# Patient Record
Sex: Male | Born: 1965 | Race: White | Hispanic: No | Marital: Married | Smoking: Never smoker
Health system: Southern US, Community
[De-identification: ages and names within clinical notes are randomized; demographics above are authoritative.]

## PROBLEM LIST (undated history)

## (undated) DIAGNOSIS — K5792 Diverticulitis of intestine, part unspecified, without perforation or abscess without bleeding: Secondary | ICD-10-CM

## (undated) HISTORY — PX: HIP SURGERY: SHX245

## (undated) HISTORY — PX: CHOLECYSTECTOMY: SHX55

---

## 2004-08-08 ENCOUNTER — Ambulatory Visit: Payer: Self-pay | Admitting: Oncology

## 2004-10-06 ENCOUNTER — Ambulatory Visit: Payer: Self-pay | Admitting: Oncology

## 2008-11-25 ENCOUNTER — Encounter: Admission: RE | Admit: 2008-11-25 | Discharge: 2008-11-25 | Payer: Self-pay | Admitting: Family Medicine

## 2010-05-24 ENCOUNTER — Emergency Department (HOSPITAL_COMMUNITY)
Admission: EM | Admit: 2010-05-24 | Discharge: 2010-05-25 | Disposition: A | Discharge status: Home / Self Care | Payer: 59 | Attending: Emergency Medicine | Admitting: Emergency Medicine

## 2010-05-24 ENCOUNTER — Emergency Department (HOSPITAL_COMMUNITY): Payer: 59

## 2010-05-24 DIAGNOSIS — K7689 Other specified diseases of liver: Secondary | ICD-10-CM | POA: Insufficient documentation

## 2010-05-24 DIAGNOSIS — R109 Unspecified abdominal pain: Secondary | ICD-10-CM | POA: Insufficient documentation

## 2010-05-24 DIAGNOSIS — R079 Chest pain, unspecified: Secondary | ICD-10-CM | POA: Insufficient documentation

## 2010-05-24 DIAGNOSIS — K5730 Diverticulosis of large intestine without perforation or abscess without bleeding: Secondary | ICD-10-CM | POA: Insufficient documentation

## 2010-05-25 ENCOUNTER — Encounter (HOSPITAL_COMMUNITY): Payer: Self-pay

## 2010-05-25 ENCOUNTER — Emergency Department (HOSPITAL_COMMUNITY): Payer: 59

## 2010-05-25 LAB — CBC
Hemoglobin: 13.6 g/dL (ref 13.0–17.0)
MCH: 28.6 pg (ref 26.0–34.0)
MCHC: 33.3 g/dL (ref 30.0–36.0)
MCV: 85.9 fL (ref 78.0–100.0)
Platelets: 277 10*3/uL (ref 150–400)
RBC: 4.76 MIL/uL (ref 4.22–5.81)

## 2010-05-25 LAB — COMPREHENSIVE METABOLIC PANEL
Alkaline Phosphatase: 74 U/L (ref 39–117)
BUN: 13 mg/dL (ref 6–23)
Chloride: 105 mEq/L (ref 96–112)
Creatinine, Ser: 0.98 mg/dL (ref 0.4–1.5)
GFR calc non Af Amer: >60 mL/min (ref >60)
Glucose, Bld: 98 mg/dL (ref 70–99)
Potassium: 4.1 mEq/L (ref 3.5–5.1)
Total Bilirubin: 0.5 mg/dL (ref 0.3–1.2)

## 2010-05-25 LAB — URINALYSIS, ROUTINE W REFLEX MICROSCOPIC
Bilirubin Urine: NEGATIVE
Glucose, UA: NEGATIVE mg/dL
Ketones, ur: NEGATIVE mg/dL
Protein, ur: NEGATIVE mg/dL
pH: 6.5 (ref 5.0–8.0)

## 2010-05-25 LAB — DIFFERENTIAL
Basophils Relative: 0 % (ref 0–1)
Eosinophils Absolute: 0.2 10*3/uL (ref 0.0–0.7)
Lymphs Abs: 3.1 10*3/uL (ref 0.7–4.0)
Monocytes Absolute: 0.9 10*3/uL (ref 0.1–1.0)
Monocytes Relative: 10 % (ref 3–12)

## 2010-05-25 LAB — LIPASE, BLOOD: Lipase: 24 U/L (ref 11–59)

## 2010-05-25 MED ORDER — IOHEXOL 300 MG/ML  SOLN
100.0000 mL | Freq: Once | INTRAMUSCULAR | Status: AC | PRN
  Administered 2010-05-25: 100 mL via INTRAVENOUS

## 2010-05-26 LAB — URINE CULTURE

## 2014-10-06 ENCOUNTER — Encounter (HOSPITAL_COMMUNITY): Payer: Self-pay

## 2014-10-06 ENCOUNTER — Emergency Department (HOSPITAL_COMMUNITY)
Admission: EM | Admit: 2014-10-06 | Discharge: 2014-10-06 | Disposition: A | Discharge status: Home / Self Care | Payer: 59 | Attending: Emergency Medicine | Admitting: Emergency Medicine

## 2014-10-06 ENCOUNTER — Emergency Department (HOSPITAL_COMMUNITY): Payer: 59

## 2014-10-06 DIAGNOSIS — R109 Unspecified abdominal pain: Secondary | ICD-10-CM | POA: Diagnosis present

## 2014-10-06 DIAGNOSIS — Z9049 Acquired absence of other specified parts of digestive tract: Secondary | ICD-10-CM | POA: Diagnosis not present

## 2014-10-06 DIAGNOSIS — K5732 Diverticulitis of large intestine without perforation or abscess without bleeding: Secondary | ICD-10-CM | POA: Diagnosis not present

## 2014-10-06 LAB — COMPREHENSIVE METABOLIC PANEL
ALBUMIN: 4.3 g/dL (ref 3.5–5.0)
ALT: 27 U/L (ref 17–63)
ANION GAP: 9 (ref 5–15)
AST: 20 U/L (ref 15–41)
Alkaline Phosphatase: 69 U/L (ref 38–126)
BUN: 16 mg/dL (ref 6–20)
CO2: 28 mmol/L (ref 22–32)
Calcium: 9.2 mg/dL (ref 8.9–10.3)
Chloride: 101 mmol/L (ref 101–111)
Creatinine, Ser: 1.03 mg/dL (ref 0.61–1.24)
GFR calc Af Amer: >60 mL/min (ref >60)
GFR calc non Af Amer: >60 mL/min (ref >60)
Glucose, Bld: 109 mg/dL — ABNORMAL HIGH (ref 65–99)
POTASSIUM: 4 mmol/L (ref 3.5–5.1)
SODIUM: 138 mmol/L (ref 135–145)
TOTAL PROTEIN: 7.3 g/dL (ref 6.5–8.1)
Total Bilirubin: 1.3 mg/dL — ABNORMAL HIGH (ref 0.3–1.2)

## 2014-10-06 LAB — CBC
HEMATOCRIT: 43.1 % (ref 39.0–52.0)
Hemoglobin: 14.6 g/dL (ref 13.0–17.0)
MCH: 29 pg (ref 26.0–34.0)
MCHC: 33.9 g/dL (ref 30.0–36.0)
MCV: 85.5 fL (ref 78.0–100.0)
PLATELETS: 270 10*3/uL (ref 150–400)
RBC: 5.04 MIL/uL (ref 4.22–5.81)
RDW: 13 % (ref 11.5–15.5)
WBC: 14.9 10*3/uL — AB (ref 4.0–10.5)

## 2014-10-06 LAB — URINE MICROSCOPIC-ADD ON

## 2014-10-06 LAB — URINALYSIS, ROUTINE W REFLEX MICROSCOPIC
BILIRUBIN URINE: NEGATIVE
GLUCOSE, UA: NEGATIVE mg/dL
KETONES UR: 15 mg/dL — AB
Leukocytes, UA: NEGATIVE
Nitrite: NEGATIVE
Protein, ur: NEGATIVE mg/dL
Specific Gravity, Urine: 1.013 (ref 1.005–1.030)
Urobilinogen, UA: 0.2 mg/dL (ref 0.0–1.0)
pH: 6 (ref 5.0–8.0)

## 2014-10-06 LAB — LIPASE, BLOOD: Lipase: 15 U/L — ABNORMAL LOW (ref 22–51)

## 2014-10-06 LAB — I-STAT CG4 LACTIC ACID, ED: Lactic Acid, Venous: 0.45 mmol/L — ABNORMAL LOW (ref 0.5–2.0)

## 2014-10-06 MED ORDER — METOCLOPRAMIDE HCL 5 MG/ML IJ SOLN
10.0000 mg | Freq: Once | INTRAMUSCULAR | Status: DC
  Filled 2014-10-06: qty 2

## 2014-10-06 MED ORDER — IOHEXOL 300 MG/ML  SOLN: 50.0000 mL | Freq: Once | INTRAMUSCULAR | Status: DC | PRN

## 2014-10-06 MED ORDER — CIPROFLOXACIN IN D5W 400 MG/200ML IV SOLN
400.0000 mg | Freq: Once | INTRAVENOUS | Status: AC
  Administered 2014-10-06: 400 mg via INTRAVENOUS
  Filled 2014-10-06: qty 200

## 2014-10-06 MED ORDER — CIPROFLOXACIN HCL 500 MG PO TABS: 500.0000 mg | ORAL_TABLET | Freq: Two times a day (BID) | ORAL | Status: DC

## 2014-10-06 MED ORDER — SODIUM CHLORIDE 0.9 % IV BOLUS (SEPSIS)
1000.0000 mL | Freq: Once | INTRAVENOUS | Status: AC
  Administered 2014-10-06: 1000 mL via INTRAVENOUS

## 2014-10-06 MED ORDER — HYDROMORPHONE HCL 1 MG/ML IJ SOLN
1.0000 mg | INTRAMUSCULAR | Status: DC | PRN
  Filled 2014-10-06: qty 1

## 2014-10-06 MED ORDER — HYDROCODONE-ACETAMINOPHEN 5-325 MG PO TABS: 1.0000 | ORAL_TABLET | Freq: Four times a day (QID) | ORAL | Status: DC | PRN

## 2014-10-06 MED ORDER — ONDANSETRON HCL 4 MG/2ML IJ SOLN
4.0000 mg | Freq: Once | INTRAMUSCULAR | Status: AC
  Administered 2014-10-06: 4 mg via INTRAVENOUS
  Filled 2014-10-06: qty 2

## 2014-10-06 MED ORDER — KETOROLAC TROMETHAMINE 30 MG/ML IJ SOLN
30.0000 mg | Freq: Once | INTRAMUSCULAR | Status: AC
  Administered 2014-10-06: 30 mg via INTRAVENOUS
  Filled 2014-10-06: qty 1

## 2014-10-06 MED ORDER — HYDROMORPHONE HCL 1 MG/ML IJ SOLN
1.0000 mg | Freq: Once | INTRAMUSCULAR | Status: AC
  Administered 2014-10-06: 1 mg via INTRAVENOUS
  Filled 2014-10-06: qty 1

## 2014-10-06 MED ORDER — IOHEXOL 300 MG/ML  SOLN
25.0000 mL | Freq: Once | INTRAMUSCULAR | Status: AC | PRN
  Administered 2014-10-06: 25 mL via ORAL

## 2014-10-06 MED ORDER — METRONIDAZOLE IN NACL 5-0.79 MG/ML-% IV SOLN
500.0000 mg | Freq: Once | INTRAVENOUS | Status: AC
Start: 2014-10-06 — End: 2014-10-06
  Administered 2014-10-06: 500 mg via INTRAVENOUS
  Filled 2014-10-06: qty 100

## 2014-10-06 MED ORDER — METRONIDAZOLE 500 MG PO TABS: 500.0000 mg | ORAL_TABLET | Freq: Three times a day (TID) | ORAL | Status: DC

## 2014-10-06 MED ORDER — IOHEXOL 300 MG/ML  SOLN
100.0000 mL | Freq: Once | INTRAMUSCULAR | Status: AC | PRN
  Administered 2014-10-06: 100 mL via INTRAVENOUS

## 2014-10-06 MED ORDER — ONDANSETRON HCL 4 MG PO TABS: 4.0000 mg | ORAL_TABLET | Freq: Four times a day (QID) | ORAL | Status: DC

## 2014-10-06 NOTE — ED Notes (Addendum)
Pt presents with c/o abdominal pain that started Monday morning. Pt reports the pain has gotten worse since the onset. Pt has had diarrhea and has had three doses of imodium.

## 2014-10-06 NOTE — ED Notes (Signed)
Pt began vomiting in CT,  This writer took IV Zofran and administered in CT  area

## 2014-10-06 NOTE — ED Provider Notes (Signed)
CSN: [id number]     Arrival date & time 10/06/14  0132 History   First MD Initiated Contact with Patient 10/06/14 0201     Chief Complaint  Patient presents with  . Abdominal Pain     (Consider location/radiation/quality/duration/timing/severity/associated sxs/prior Treatment) HPI Comments: 49 year old male with a history of BPH presents to the emergency department for further evaluation of abdominal pain. Patient states that abdominal pain began Monday morning. He describes the pain as intermittent up until the last 24 hours. Pain is now constant. He describes the pain as a deep ache which radiates to the tip of his penis. He experienced diarrhea at onset of symptoms which was watery and black in color. He took a total of 3 doses of Imodium; one tablet Monday morning, 1 tablet Monday evening, and a third tablet yesterday morning. He has not had a bowel movement in the last 24 hours. Patient denies any hematuria, fever, chest pain, shortness of breath, nausea, vomiting, hematochezia. He feels as though his pain is relieved slightly after urination. He denies a history of kidney stones. He states that his father had a hx of kidney stones. Abdominal surgical history significant for cholecystectomy.  Patient is a 49 y.o. male presenting with abdominal pain. The history is provided by the patient. No language interpreter was used.  Abdominal Pain Associated symptoms: diarrhea   Associated symptoms: no chest pain, no dysuria, no fever, no nausea, no shortness of breath and no vomiting     History reviewed. No pertinent past medical history. Past Surgical History  Procedure Laterality Date  . Cholecystectomy    . Hip surgery     No family history on file. History  Substance Use Topics  . Smoking status: Never Smoker   . Smokeless tobacco: Not on file  . Alcohol Use: Yes     Comment: occasionally     Review of Systems  Constitutional: Negative for fever.  Respiratory: Negative for  shortness of breath.   Cardiovascular: Negative for chest pain.  Gastrointestinal: Positive for abdominal pain and diarrhea. Negative for nausea and vomiting.  Genitourinary: Negative for dysuria, flank pain, discharge, penile swelling, scrotal swelling and testicular pain.  All other systems reviewed and are negative.   Allergies  Other and Sulfa antibiotics  Home Medications   Prior to Admission medications   Medication Sig Start Date End Date Taking? Authorizing Provider  ALPRAZolam Prudy Feeler) 0.5 MG tablet Take 0.5 mg by mouth at bedtime as needed. sleep   Yes Historical Provider, MD  diazepam (VALIUM) 5 MG tablet Take 5 mg by mouth 2 (two) times daily as needed. anxiety   Yes Historical Provider, MD  fexofenadine (ALLEGRA) 180 MG tablet Take 1 tablet by mouth daily as needed. allergies 12/17/07  Yes Historical Provider, MD  ibuprofen (ADVIL,MOTRIN) 200 MG tablet Take 400 mg by mouth every 6 (six) hours as needed for moderate pain.   Yes Historical Provider, MD  ciprofloxacin (CIPRO) 500 MG tablet Take 1 tablet (500 mg total) by mouth 2 (two) times daily. 10/06/14   Antony Madura, PA-C  HYDROcodone-acetaminophen (NORCO/VICODIN) 5-325 MG per tablet Take 1-2 tablets by mouth every 6 (six) hours as needed. 10/06/14   Antony Madura, PA-C  metroNIDAZOLE (FLAGYL) 500 MG tablet Take 1 tablet (500 mg total) by mouth 3 (three) times daily. 10/06/14   Antony Madura, PA-C  ondansetron (ZOFRAN) 4 MG tablet Take 1 tablet (4 mg total) by mouth every 6 (six) hours. Take as needed for nausea/vomiting 10/06/14  Antony Madura, PA-C  tiZANidine (ZANAFLEX) 4 MG tablet Take 1 tablet by mouth daily as needed. Muscle spasms 09/30/14   Historical Provider, MD   BP 123/81 mmHg  Pulse 89  Temp(Src) 98.3 F (36.8 C) (Oral)  Resp 18  SpO2 99%   Physical Exam  Constitutional: He is oriented to person, place, and time. He appears well-developed and well-nourished. No distress.  Patient rocking in the bed, appearing  uncomfortable  HENT:  Head: Normocephalic and atraumatic.  Mouth/Throat: Oropharynx is clear and moist. No oropharyngeal exudate.  Eyes: Conjunctivae and EOM are normal. No scleral icterus.  Neck: Normal range of motion.  Cardiovascular: Normal rate, regular rhythm and intact distal pulses.   Pulmonary/Chest: Effort normal and breath sounds normal. No respiratory distress. He has no wheezes. He has no rales.  Respirations even and unlabored. Lungs clear.  Abdominal: Soft. Normal appearance. He exhibits no distension and no mass. There is tenderness in the right lower quadrant. There is no guarding and no tenderness at McBurney's point.    Tenderness to palpation in the suprapubic region, mostly on the right. No tenderness at McBurney's point. Abdomen is soft. No masses. No peritoneal signs.  Musculoskeletal: Normal range of motion.  Neurological: He is alert and oriented to person, place, and time. He exhibits normal muscle tone. Coordination normal.  Skin: Skin is warm and dry. No rash noted. He is not diaphoretic. No erythema. No pallor.  Psychiatric: He has a normal mood and affect. His behavior is normal.  Nursing note and vitals reviewed.   ED Course  Procedures (including critical care time) Labs Review Labs Reviewed  LIPASE, BLOOD - Abnormal; Notable for the following:    Lipase 15 (*)    All other components within normal limits  COMPREHENSIVE METABOLIC PANEL - Abnormal; Notable for the following:    Glucose, Bld 109 (*)    Total Bilirubin 1.3 (*)    All other components within normal limits  CBC - Abnormal; Notable for the following:    WBC 14.9 (*)    All other components within normal limits  URINALYSIS, ROUTINE W REFLEX MICROSCOPIC (NOT AT [facility]) - Abnormal; Notable for the following:    Hgb urine dipstick TRACE (*)    Ketones, ur 15 (*)    All other components within normal limits  I-STAT CG4 LACTIC ACID, ED - Abnormal; Notable for the following:    Lactic Acid,  Venous 0.45 (*)    All other components within normal limits  URINE MICROSCOPIC-ADD ON    Imaging Review Ct Abdomen Pelvis W Contrast  10/06/2014   CLINICAL DATA:  Initial evaluation for acute right lower abdominal pain.  EXAM: CT ABDOMEN AND PELVIS WITH CONTRAST  TECHNIQUE: Multidetector CT imaging of the abdomen and pelvis was performed using the standard protocol following bolus administration of intravenous contrast.  CONTRAST:  OMNIPAQUE IOHEXOL 300 MG/ML  SOLN  COMPARISON:  Prior CT from 05/25/2010.  FINDINGS: Visualized lung bases are clear.  2 cm hypodense lesion with peripheral nodular enhancement within the inferior right hepatic lobe is most consistent with a benign hemangioma. Additional scattered hypodense lesions within the liver are too small the characterize by CT, but may reflect small cysts. Liver is otherwise unremarkable. Gallbladder is absent. Mild intra and extrahepatic biliary dilatation, which may reflect post cholecystectomy changes.  Spleen, adrenal glands, and pancreas demonstrate a normal contrast enhanced appearance.  Kidneys are equal in size with symmetric enhancement. No nephrolithiasis, hydronephrosis, or focal enhancing renal mass.  Stomach within normal limits. No evidence for bowel obstruction. Appendix well visualized in the right lower quadrant and is of normal caliber and appearance without associated inflammatory changes to suggest acute appendicitis. Multiple inflamed diverticula present within the sigmoid colon, compatible with acute diverticulitis. No free air to suggest perforation. No diverticular abscess.  Bladder within normal limits.  Prostate normal.  No free air identified. Trace free fluid within the pelvis, likely related to the inflammatory changes about sigmoid colon. No pathologically enlarged intra-abdominal pelvic lymph nodes. Normal intravascular enhancement seen within the intra-abdominal aorta and its branch vessels. 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  No acute osseous abnormality. No worrisome lytic or blastic osseous lesions.  IMPRESSION: 1. Findings consistent with acute sigmoid diverticulitis. No evidence for perforation or other complication. 2. No other acute intra-abdominal pelvic process identified.   Electronically Signed   By: Rise MuBenjamin  McClintock M.D.   On: 10/06/2014 05:50     EKG Interpretation None      MDM   Final diagnoses:  Sigmoid diverticulitis    49 year old male presents to the emergency department for complaints of abdominal pain with diarrhea. Physical exam and workup notable for leukocytosis of 14.9 with evidence of acute sigmoid diverticulitis on CT scan. No evidence of perforation or abscess. Patient hydrated in the emergency department with IV fluids. He has been given Toradol and Dilaudid for pain control. Patient opts to receive his first dose of antibiotics via IV in the emergency department prior to discharge. Will give initial dose of ciprofloxacin and Flagyl.   Of note, patient has a prescription for Zanaflex in his daily medications. Patient states that he received this prescription from his primary care doctor, but has yet to take any of it. He states that he is comfortable with not taking this medication over the next 10 days while he takes his antibiotics for diverticulitis. Plan to d/c with Zofran and Norco as well for symptom management. Have recommended a low fiber diet to prevent recurrence as well as PCP f/u this week to ensure resolution of symptoms. Return precautions discussed and provided. Patient agreeable to plan with no unaddressed concerns.   Filed Vitals:   10/06/14 0151 10/06/14 0404  BP: 126/91 123/81  Pulse: 97 89  Temp: 98.3 F (36.8 C)   TempSrc: Oral   Resp: 20 18  SpO2: 100% 99%     Antony MaduraKelly Pernella Ackerley, PA-C 10/06/14 [id number]  April Palumbo, MD 10/06/14 [id number]

## 2014-10-06 NOTE — Discharge Instructions (Signed)
Take Ciprofloxacin and Flagyl as prescribed for Diverticulitis. Do not take Zanaflex/Tizanidine while taking Ciprofloxacin. You may take Norco as needed for pain and Zofran for nausea/vomiting. Recommend that you drink plenty of fluids and change to a low fiber diet. Follow up with your primary doctor for a recheck of symptoms.  Diverticulitis Diverticulitis is inflammation or infection of small pouches in your colon that form when you have a condition called diverticulosis. The pouches in your colon are called diverticula. Your colon, or large intestine, is where water is absorbed and stool is formed. Complications of diverticulitis can include:  Bleeding.  Severe infection.  Severe pain.  Perforation of your colon.  Obstruction of your colon. CAUSES  Diverticulitis is caused by bacteria. Diverticulitis happens when stool becomes trapped in diverticula. This allows bacteria to grow in the diverticula, which can lead to inflammation and infection. RISK FACTORS People with diverticulosis are at risk for diverticulitis. Eating a diet that does not include enough fiber from fruits and vegetables may make diverticulitis more likely to develop. SYMPTOMS  Symptoms of diverticulitis may include:  Abdominal pain and tenderness. The pain is normally located on the left side of the abdomen, but may occur in other areas.  Fever and chills.  Bloating.  Cramping.  Nausea.  Vomiting.  Constipation.  Diarrhea.  Blood in your stool. DIAGNOSIS  Your health care provider will ask you about your medical history and do a physical exam. You may need to have tests done because many medical conditions can cause the same symptoms as diverticulitis. Tests may include:  Blood tests.  Urine tests.  Imaging tests of the abdomen, including X-rays and CT scans. When your condition is under control, your health care provider may recommend that you have a colonoscopy. A colonoscopy can show how  severe your diverticula are and whether something else is causing your symptoms. TREATMENT  Most cases of diverticulitis are mild and can be treated at home. Treatment may include:  Taking over-the-counter pain medicines.  Following a clear liquid diet.  Taking antibiotic medicines by mouth for 7-10 days. More severe cases may be treated at a hospital. Treatment may include:  Not eating or drinking.  Taking prescription pain medicine.  Receiving antibiotic medicines through an IV tube.  Receiving fluids and nutrition through an IV tube.  Surgery. HOME CARE INSTRUCTIONS   Follow your health care provider's instructions carefully.  Follow a full liquid diet or other diet as directed by your health care provider. After your symptoms improve, your health care provider may tell you to change your diet. He or she may recommend you eat a high-fiber diet. Fruits and vegetables are good sources of fiber. Fiber makes it easier to pass stool.  Take fiber supplements or probiotics as directed by your health care provider.  Only take medicines as directed by your health care provider.  Keep all your follow-up appointments. SEEK MEDICAL CARE IF:   Your pain does not improve.  You have a hard time eating food.  Your bowel movements do not return to normal. SEEK IMMEDIATE MEDICAL CARE IF:   Your pain becomes worse.  Your symptoms do not get better.  Your symptoms suddenly get worse.  You have a fever.  You have repeated vomiting.  You have bloody or black, tarry stools. MAKE SURE YOU:   Understand these instructions.  Will watch your condition.  Will get help right away if you are not doing well or get worse. Document Released: 12/13/2004 Document Revised:  03/10/2013 Document Reviewed: 01/28/2013 ExitCare Patient Information 2015 [place], [place]. This information is not intended to replace advice given to you by your health care provider. Make sure you discuss any questions  you have with your health care provider.  Low-Fiber Diet Fiber is found in fruits, vegetables, and whole grains. A low-fiber diet restricts fibrous foods that are not digested in the small intestine. A diet containing about 10-15 grams of fiber per day is considered low fiber. Low-fiber diets may be used to:  Promote healing and rest the bowel during intestinal flare-ups.  Prevent blockage of a partially obstructed or narrowed gastrointestinal tract.  Reduce fecal weight and volume.  Slow the movement of feces. You may be on a low-fiber diet as a transitional diet following surgery, after an injury (trauma), or because of a short (acute) or lifelong (chronic) illness. Your health care provider will determine the length of time you need to stay on this diet.  WHAT DO I NEED TO KNOW ABOUT A LOW-FIBER DIET? Always check the fiber content on the packaging's Nutrition Facts label, especially on foods from the grains list. Ask your dietitian if you have questions about specific foods that are related to your condition, especially if the food is not listed below. In general, a low-fiber food will have less than 2 g of fiber. WHAT FOODS CAN I EAT? Grains All breads and crackers made with white flour. Sweet rolls, doughnuts, waffles, pancakes, [place] toast, bagels. Pretzels, Melba toast, zwieback. Well-cooked cereals, such as cornmeal, farina, or cream cereals. Dry cereals that do not contain whole grains, fruit, or nuts, such as refined corn, wheat, rice, and oat cereals. Potatoes prepared any way without skins, plain pastas and noodles, refined white rice. Use white flour for baking and making sauces. Use allowed list of grains for casseroles, dumplings, and puddings.  Vegetables Strained tomato and vegetable juices. Fresh lettuce, cucumber, spinach. Well-cooked (no skin or pulp) or canned vegetables, such as asparagus, bean sprouts, beets, carrots, green beans, mushrooms, potatoes, pumpkin, spinach,  yellow squash, tomato sauce/puree, turnips, yams, and zucchini. Keep servings limited to  cup.  Fruits All fruit juices except prune juice. Cooked or canned fruits without skin and seeds, such as applesauce, apricots, cherries, fruit cocktail, grapefruit, grapes, mandarin oranges, melons, peaches, pears, pineapple, and plums. Fresh fruits without skin, such as apricots, avocados, bananas, melons, pineapple, nectarines, and peaches. Keep servings limited to  cup or 1 piece.  Meat and Other Protein Sources Ground or well-cooked tender beef, ham, veal, lamb, pork, or poultry. Eggs, plain cheese. Fish, oysters, shrimp, lobster, and other seafood. Liver, organ meats. Smooth nut butters. Dairy All milk products and alternative dairy substitutes, such as soy, rice, almond, and coconut, not containing added whole nuts, seeds, or added fruit. Beverages Decaf coffee, fruit, and vegetable juices or smoothies (small amounts, with no pulp or skins, and with fruits from allowed list), sports drinks, herbal tea. Condiments Ketchup, mustard, vinegar, cream sauce, cheese sauce, cocoa powder. Spices in moderation, such as allspice, basil, bay leaves, celery powder or leaves, cinnamon, cumin powder, curry powder, ginger, mace, marjoram, onion or garlic powder, oregano, paprika, parsley flakes, ground pepper, rosemary, sage, savory, tarragon, thyme, and turmeric. Sweets and Desserts Plain cakes and cookies, pie made with allowed fruit, pudding, custard, cream pie. Gelatin, fruit, ice, sherbet, frozen ice pops. Ice cream, ice milk without nuts. Plain hard candy, honey, jelly, molasses, syrup, sugar, chocolate syrup, gumdrops, marshmallows. Limit overall sugar intake.  Fats and Oil Margarine, butter,  cream, mayonnaise, salad oils, plain salad dressings made from allowed foods. Choose healthy fats such as olive oil, canola oil, and omega-3 fatty acids (such as found in salmon or tuna) when possible.  Other Bouillon,  broth, or cream soups made from allowed foods. Any strained soup. Casseroles or mixed dishes made with allowed foods. The items listed above may not be a complete list of recommended foods or beverages. Contact your dietitian for more options.  WHAT FOODS ARE NOT RECOMMENDED? Grains All whole wheat and whole grain breads and crackers. Multigrains, rye, bran seeds, nuts, or coconut. Cereals containing whole grains, multigrains, bran, coconut, nuts, raisins. Cooked or dry oatmeal, steel-cut oats. Coarse wheat cereals, granola. Cereals advertised as high fiber. Potato skins. Whole grain pasta, wild or brown rice. Popcorn. Coconut flour. Bran, buckwheat, corn bread, multigrains, rye, wheat germ.  Vegetables Fresh, cooked or canned vegetables, such as artichokes, asparagus, beet greens, broccoli, Brussels sprouts, cabbage, celery, cauliflower, corn, eggplant, kale, legumes or beans, okra, peas, and tomatoes. Avoid large servings of any vegetables, especially raw vegetables.  Fruits Fresh fruits, such as apples with or without skin, berries, cherries, figs, grapes, grapefruit, guavas, kiwis, mangoes, oranges, papayas, pears, persimmons, pineapple, and pomegranate. Prune juice and juices with pulp, stewed or dried prunes. Dried fruits, dates, raisins. Fruit seeds or skins. Avoid large servings of all fresh fruits. Meats and Other Protein Sources Tough, fibrous meats with gristle. Chunky nut butter. Cheese made with seeds, nuts, or other foods not recommended. Nuts, seeds, legumes (beans, including baked beans), dried peas, beans, lentils.  Dairy Yogurt or cheese that contains nuts, seeds, or added fruit.  Beverages Fruit juices with high pulp, prune juice. Caffeinated coffee and teas.  Condiments Coconut, maple syrup, pickles, olives. Sweets and Desserts Desserts, cookies, or candies that contain nuts or coconut, chunky peanut butter, dried fruits. Jams, preserves with seeds, marmalade. Large amounts of  sugar and sweets. Any other dessert made with fruits from the not recommended list.  Other Soups made from vegetables that are not recommended or that contain other foods not recommended.  The items listed above may not be a complete list of foods and beverages to avoid. Contact your dietitian for more information. Document Released: 08/25/2001 Document Revised: 03/10/2013 Document Reviewed: 01/26/2013 [facility] Center[facility] Patient Information 2015 [place], [place]. This information is not intended to replace advice given to you by your health care provider. Make sure you discuss any questions you have with your health care provider.

## 2014-10-06 NOTE — ED Provider Notes (Signed)
  Physical Exam  BP 119/74 mmHg  Pulse 70  Temp(Src) 98.5 F (36.9 C) (Oral)  Resp 18  SpO2 98%  Physical Exam No acute distress and resting comfortably.    ED Course  Procedures Patient was signed out to me by Antony MaduraKelly Humes, PA-C.  The patient has sigmoid diverticulitis without perforation and the plan is to discharge the patient after IV antibiotics. 8:55am Patient is still receiving antibiotics.  No complaints of pain or nausea.  9:20am Patient discharged with no concerns or unanswered questions.         Catha GosselinHanna Patel-Mills, PA-C 10/06/14 [id number]  April Palumbo, MD 10/07/14 0236

## 2014-10-06 NOTE — ED Notes (Signed)
Pt ambulated to bathroom without assistance 

## 2015-06-29 ENCOUNTER — Ambulatory Visit (HOSPITAL_COMMUNITY)
Admission: EM | Admit: 2015-06-29 | Discharge: 2015-06-29 | Disposition: A | Discharge status: Home / Self Care | Payer: Managed Care, Other (non HMO) | Attending: Family Medicine | Admitting: Family Medicine

## 2015-06-29 ENCOUNTER — Encounter (HOSPITAL_COMMUNITY): Payer: Self-pay | Admitting: *Deleted

## 2015-06-29 DIAGNOSIS — K5732 Diverticulitis of large intestine without perforation or abscess without bleeding: Secondary | ICD-10-CM

## 2015-06-29 HISTORY — DX: Diverticulitis of intestine, part unspecified, without perforation or abscess without bleeding: K57.92

## 2015-06-29 MED ORDER — HYDROCODONE-ACETAMINOPHEN 5-325 MG PO TABS: 1.0000 | ORAL_TABLET | Freq: Four times a day (QID) | ORAL | Status: AC | PRN

## 2015-06-29 MED ORDER — KETOROLAC TROMETHAMINE 30 MG/ML IJ SOLN
INTRAMUSCULAR | Status: AC
  Filled 2015-06-29: qty 1

## 2015-06-29 MED ORDER — KETOROLAC TROMETHAMINE 30 MG/ML IJ SOLN
30.0000 mg | Freq: Once | INTRAMUSCULAR | Status: AC
  Administered 2015-06-29: 30 mg via INTRAMUSCULAR

## 2015-06-29 MED ORDER — CIPROFLOXACIN HCL 500 MG PO TABS: 500.0000 mg | ORAL_TABLET | Freq: Two times a day (BID) | ORAL | Status: AC

## 2015-06-29 MED ORDER — METRONIDAZOLE 500 MG PO TABS: 500.0000 mg | ORAL_TABLET | Freq: Two times a day (BID) | ORAL | Status: AC

## 2015-06-29 MED ORDER — ONDANSETRON 4 MG PO TBDP
ORAL_TABLET | ORAL | Status: AC
  Filled 2015-06-29: qty 2

## 2015-06-29 MED ORDER — ONDANSETRON 4 MG PO TBDP
8.0000 mg | ORAL_TABLET | Freq: Once | ORAL | Status: AC
  Administered 2015-06-29: 8 mg via ORAL

## 2015-06-29 NOTE — ED Provider Notes (Signed)
CSN: [id number]     Arrival date & time 06/29/15  1808 History   First MD Initiated Contact with Patient 06/29/15 1934     Chief Complaint  Patient presents with  . Abdominal Pain   (Consider location/radiation/quality/duration/timing/severity/associated sxs/prior Treatment) Patient is a 50 y.o. male presenting with abdominal pain. The history is provided by the patient and the spouse.  Abdominal Pain Pain location:  LLQ Pain quality: cramping and squeezing   Pain radiates to:  Does not radiate Pain severity:  Moderate Onset quality:  Gradual Duration:  3 days Progression:  Worsening Chronicity:  Recurrent Context comment:  Similar sx last yr with dx of diverticulitis, no problems until relapse. Relieved by:  None tried Worsened by:  Nothing tried Ineffective treatments:  None tried Associated symptoms: chills and nausea   Associated symptoms: no constipation, no diarrhea, no fever, no hematochezia and no vomiting     Past Medical History  Diagnosis Date  . Diverticulitis    Past Surgical History  Procedure Laterality Date  . Cholecystectomy    . Hip surgery     History reviewed. No pertinent family history. Social History  Substance Use Topics  . Smoking status: Never Smoker   . Smokeless tobacco: None  . Alcohol Use: Yes     Comment: occasionally     Review of Systems  Constitutional: Positive for chills. Negative for fever.  HENT: Negative.   Respiratory: Negative.   Cardiovascular: Negative.   Gastrointestinal: Positive for nausea and abdominal pain. Negative for vomiting, diarrhea, constipation, blood in stool and hematochezia.  Genitourinary: Negative.   Musculoskeletal: Negative.   All other systems reviewed and are negative.   Allergies  Other and Sulfa antibiotics  Home Medications   Prior to Admission medications   Medication Sig Start Date End Date Taking? Authorizing Provider  ALPRAZolam Prudy Feeler) 0.5 MG tablet Take 0.5 mg by mouth at bedtime as  needed. sleep    Historical Provider, MD  ciprofloxacin (CIPRO) 500 MG tablet Take 1 tablet (500 mg total) by mouth 2 (two) times daily. 06/29/15   Linna Hoff, MD  diazepam (VALIUM) 5 MG tablet Take 5 mg by mouth 2 (two) times daily as needed. anxiety    Historical Provider, MD  fexofenadine (ALLEGRA) 180 MG tablet Take 1 tablet by mouth daily as needed. allergies 12/17/07   Historical Provider, MD  HYDROcodone-acetaminophen (NORCO/VICODIN) 5-325 MG tablet Take 1-2 tablets by mouth every 6 (six) hours as needed. For pain 06/29/15   Linna Hoff, MD  ibuprofen (ADVIL,MOTRIN) 200 MG tablet Take 400 mg by mouth every 6 (six) hours as needed for moderate pain.    Historical Provider, MD  metroNIDAZOLE (FLAGYL) 500 MG tablet Take 1 tablet (500 mg total) by mouth 2 (two) times daily. 06/29/15   Linna Hoff, MD  ondansetron (ZOFRAN) 4 MG tablet Take 1 tablet (4 mg total) by mouth every 6 (six) hours. Take as needed for nausea/vomiting 10/06/14   Antony Madura, PA-C  tiZANidine (ZANAFLEX) 4 MG tablet Take 1 tablet by mouth daily as needed. Muscle spasms 09/30/14   Historical Provider, MD   Meds Ordered and Administered this Visit   Medications  ketorolac (TORADOL) 30 MG/ML injection 30 mg (not administered)  ondansetron (ZOFRAN-ODT) disintegrating tablet 8 mg (not administered)    BP 132/78 mmHg  Pulse 78  Temp(Src) 98.6 F (37 C) (Oral)  Resp 18  SpO2 100% No data found.   Physical Exam  Constitutional: He is oriented to person,  place, and time. He appears well-developed and well-nourished. No distress.  Abdominal: Soft. Normal appearance. He exhibits no distension and no mass. Bowel sounds are decreased. There is tenderness in the left lower quadrant. There is no rigidity, no rebound, no guarding and no CVA tenderness.  Neurological: He is alert and oriented to person, place, and time.  Skin: Skin is warm and dry.  Nursing note and vitals reviewed.   ED Course  Procedures (including  critical care time)  Labs Review Labs Reviewed - No data to display  Imaging Review No results found.   Visual Acuity Review  Right Eye Distance:   Left Eye Distance:   Bilateral Distance:    Right Eye Near:   Left Eye Near:    Bilateral Near:         MDM   1. Diverticulitis large intestine w/o perforation or abscess w/o bleeding       Linna HoffJames D Cressida Milford, MD 06/29/15 [phone]

## 2015-06-29 NOTE — ED Notes (Signed)
PT  REPORTS  ABDOMINAL  PAIN  WITH  SOME  NAUSEA          AS   WELL  WITH   SYMPTOMS  X  2-3  DAYS     PT   REPORTS  A  HISTORY  OF  DIVERTICULITIS    PT AMBULATED   TO  ROOM  WITH A  STEADY  FLUID  GAIT   PT     pT   REPORTS  HAS  TAKEN  SOME  PAIN MEDS   AND  APPLIED  HEAT  TO  THE  AFFECTED  AREA

## 2015-06-29 NOTE — Discharge Instructions (Signed)
Clear liquids for 48h then advance as tolerated. See dr Duanne Guessdewey on fri for recheck, return to ER if problem gets worse. Take all of antibiotics.

## 2015-07-02 ENCOUNTER — Emergency Department (HOSPITAL_COMMUNITY): Payer: Managed Care, Other (non HMO)

## 2015-07-02 ENCOUNTER — Emergency Department (HOSPITAL_COMMUNITY)
Admission: EM | Admit: 2015-07-02 | Discharge: 2015-07-02 | Disposition: A | Discharge status: Home / Self Care | Payer: Managed Care, Other (non HMO) | Attending: Emergency Medicine | Admitting: Emergency Medicine

## 2015-07-02 ENCOUNTER — Encounter (HOSPITAL_COMMUNITY): Payer: Self-pay

## 2015-07-02 DIAGNOSIS — R1031 Right lower quadrant pain: Secondary | ICD-10-CM | POA: Diagnosis present

## 2015-07-02 DIAGNOSIS — K5732 Diverticulitis of large intestine without perforation or abscess without bleeding: Secondary | ICD-10-CM | POA: Diagnosis not present

## 2015-07-02 DIAGNOSIS — R61 Generalized hyperhidrosis: Secondary | ICD-10-CM | POA: Diagnosis not present

## 2015-07-02 DIAGNOSIS — Z9049 Acquired absence of other specified parts of digestive tract: Secondary | ICD-10-CM | POA: Insufficient documentation

## 2015-07-02 DIAGNOSIS — R630 Anorexia: Secondary | ICD-10-CM | POA: Diagnosis not present

## 2015-07-02 LAB — URINALYSIS, ROUTINE W REFLEX MICROSCOPIC
BILIRUBIN URINE: NEGATIVE
Glucose, UA: NEGATIVE mg/dL
Hgb urine dipstick: NEGATIVE
Ketones, ur: 15 mg/dL — AB
NITRITE: NEGATIVE
PH: 5 (ref 5.0–8.0)
PROTEIN: NEGATIVE mg/dL
Specific Gravity, Urine: 1.026 (ref 1.005–1.030)

## 2015-07-02 LAB — COMPREHENSIVE METABOLIC PANEL
ALT: 60 U/L (ref 17–63)
AST: 25 U/L (ref 15–41)
Albumin: 4.4 g/dL (ref 3.5–5.0)
Alkaline Phosphatase: 71 U/L (ref 38–126)
Anion gap: 11 (ref 5–15)
BUN: 18 mg/dL (ref 6–20)
CALCIUM: 9.1 mg/dL (ref 8.9–10.3)
CO2: 28 mmol/L (ref 22–32)
Chloride: 102 mmol/L (ref 101–111)
Creatinine, Ser: 1.1 mg/dL (ref 0.61–1.24)
GFR calc Af Amer: >60 mL/min (ref >60)
GFR calc non Af Amer: >60 mL/min (ref >60)
Glucose, Bld: 99 mg/dL (ref 65–99)
Potassium: 4 mmol/L (ref 3.5–5.1)
Sodium: 141 mmol/L (ref 135–145)
TOTAL PROTEIN: 7.2 g/dL (ref 6.5–8.1)
Total Bilirubin: 0.6 mg/dL (ref 0.3–1.2)

## 2015-07-02 LAB — CBC
HEMATOCRIT: 42.3 % (ref 39.0–52.0)
Hemoglobin: 14.8 g/dL (ref 13.0–17.0)
MCH: 28.9 pg (ref 26.0–34.0)
MCHC: 35 g/dL (ref 30.0–36.0)
MCV: 82.6 fL (ref 78.0–100.0)
PLATELETS: 298 10*3/uL (ref 150–400)
RBC: 5.12 MIL/uL (ref 4.22–5.81)
RDW: 12.4 % (ref 11.5–15.5)
WBC: 10.3 10*3/uL (ref 4.0–10.5)

## 2015-07-02 LAB — URINE MICROSCOPIC-ADD ON: SQUAMOUS EPITHELIAL / LPF: NONE SEEN

## 2015-07-02 LAB — LIPASE, BLOOD: Lipase: 17 U/L (ref 11–51)

## 2015-07-02 MED ORDER — IOPAMIDOL (ISOVUE-300) INJECTION 61%
100.0000 mL | Freq: Once | INTRAVENOUS | Status: AC | PRN
  Administered 2015-07-02: 100 mL via INTRAVENOUS

## 2015-07-02 MED ORDER — METRONIDAZOLE IN NACL 5-0.79 MG/ML-% IV SOLN: 500.0000 mg | Freq: Once | INTRAVENOUS | Status: DC

## 2015-07-02 MED ORDER — CIPROFLOXACIN IN D5W 400 MG/200ML IV SOLN: 400.0000 mg | Freq: Once | INTRAVENOUS | Status: DC

## 2015-07-02 MED ORDER — SODIUM CHLORIDE 0.9 % IV SOLN
3.0000 g | Freq: Once | INTRAVENOUS | Status: AC
  Administered 2015-07-02: 3 g via INTRAVENOUS
  Filled 2015-07-02: qty 3

## 2015-07-02 NOTE — ED Notes (Signed)
Patient transported to CT 

## 2015-07-02 NOTE — ED Notes (Signed)
Pt admits to having nausea & some sweating.  C/o pain to RLQ.

## 2015-07-02 NOTE — ED Notes (Signed)
Pt presents with c/o abdominal pain that started on Sunday, approx 6 days ago. Pt has a hx of diverticulitis. Pt reports the pain started on the left side of his abdomen and has since moved to the right side. Pt reports the pain feels like constant pressure. Pt reports some nausea but no vomiting or diarrhea.

## 2015-07-02 NOTE — ED Notes (Signed)
PA-C in to room to speak to pt.

## 2015-07-02 NOTE — ED Provider Notes (Signed)
CSN: [id number]     Arrival date & time 07/02/15  1353 History   First MD Initiated Contact with Patient 07/02/15 1621     Chief Complaint  Patient presents with  . Abdominal Pain     (Consider location/radiation/quality/duration/timing/severity/associated sxs/prior Treatment) Patient is a 50 y.o. male presenting with abdominal pain. The history is provided by the patient and the spouse.  Abdominal Pain Associated symptoms: chills and nausea   Associated symptoms: no chest pain, no diarrhea, no dysuria, no fatigue, no fever, no hematuria, no shortness of breath and no vomiting    Patient with history of diverticulitis and cholecystectomy with 5 days of abdominal pain who went to urgent care 3 days ago for similar symptoms as a previous episode of diverticulitis in July 2016. He was prescribed Flagyl and Cipro and he has been taking them as prescribed. He states his pain was sharp/stabbing before going to urgent care and that pain has since improved. He still has pain in his LLQ and as of today in his RLQ that he describes as "constant pressure". He has taken Norco with some relief. He states associated loose stools 5 days ago that have improved, decreased BM's, anorexia, nausea, sweats, chills, abdominal bloating, and increased flatulence. He denies fever, hematochezia, hematuria, painful defecation, fatigue, vomiting, recent illness.   Past Medical History  Diagnosis Date  . Diverticulitis    Past Surgical History  Procedure Laterality Date  . Cholecystectomy    . Hip surgery     No family history on file. Social History  Substance Use Topics  . Smoking status: Never Smoker   . Smokeless tobacco: None  . Alcohol Use: Yes     Comment: occasionally     Review of Systems  Constitutional: Positive for chills, diaphoresis and appetite change. Negative for fever, fatigue and unexpected weight change.  HENT: Negative for hearing loss and trouble swallowing.   Eyes: Negative for visual  disturbance.  Respiratory: Negative for chest tightness and shortness of breath.   Cardiovascular: Negative for chest pain and leg swelling.  Gastrointestinal: Positive for nausea, abdominal pain and abdominal distention. Negative for vomiting, diarrhea, blood in stool and anal bleeding.  Genitourinary: Negative for dysuria, hematuria, flank pain and difficulty urinating.  Musculoskeletal: Negative for neck pain and neck stiffness.  Skin: Negative for rash.  Neurological: Negative for dizziness, syncope, light-headedness and numbness.  Psychiatric/Behavioral: Negative for confusion.      Allergies  Other and Sulfa antibiotics  Home Medications   Prior to Admission medications   Medication Sig Start Date End Date Taking? Authorizing Provider  ALPRAZolam Prudy Feeler) 1 MG tablet Take 0.25-0.5 mg by mouth at bedtime as needed for sleep.   Yes Historical Provider, MD  ciprofloxacin (CIPRO) 500 MG tablet Take 1 tablet (500 mg total) by mouth 2 (two) times daily. Patient taking differently: Take 500 mg by mouth 2 (two) times daily. Started 04/12 for 15 days 06/29/15  Yes Linna Hoff, MD  HYDROcodone-acetaminophen (NORCO/VICODIN) 5-325 MG tablet Take 1-2 tablets by mouth every 6 (six) hours as needed. For pain Patient taking differently: Take 1 tablet by mouth every 6 (six) hours as needed for moderate pain. For pain 06/29/15  Yes Linna Hoff, MD  metroNIDAZOLE (FLAGYL) 500 MG tablet Take 1 tablet (500 mg total) by mouth 2 (two) times daily. Patient taking differently: Take 500 mg by mouth 2 (two) times daily. Started 04/12 for 15 days 06/29/15  Yes Linna Hoff, MD   BP 149/91  mmHg  Pulse 59  Temp(Src) 97.7 F (36.5 C) (Oral)  Resp 18  SpO2 100% Physical Exam  Constitutional: He is oriented to person, place, and time. He appears well-developed and well-nourished. No distress.  HENT:  Head: Normocephalic and atraumatic.  Eyes: Conjunctivae are normal.  Neck: Normal range of motion.   Cardiovascular: Normal rate, regular rhythm and normal heart sounds.   Pulmonary/Chest: Effort normal and breath sounds normal.  Abdominal: Soft.  No deformities, hyperactive bowel sounds x 4 quads, guarding, TTP of the LLQ and the RLQ, dullness to percussion of the LUQ and the LLQ, no masses appreciated  Musculoskeletal: Normal range of motion.  Neurological: He is alert and oriented to person, place, and time. Coordination normal.  Skin: Skin is warm and dry. No rash noted.  Psychiatric: He has a normal mood and affect. His behavior is normal. Judgment normal.    ED Course  Procedures (including critical care time) Labs Review Labs Reviewed  URINALYSIS, ROUTINE W REFLEX MICROSCOPIC (NOT AT [facility]) - Abnormal; Notable for the following:    Ketones, ur 15 (*)    Leukocytes, UA SMALL (*)    All other components within normal limits  URINE MICROSCOPIC-ADD ON - Abnormal; Notable for the following:    Bacteria, UA RARE (*)    All other components within normal limits  LIPASE, BLOOD  COMPREHENSIVE METABOLIC PANEL  CBC    Imaging Review Ct Abdomen Pelvis W Contrast  07/02/2015  CLINICAL DATA:  Acute onset of left-sided abdominal pain and pressure. Nausea. Initial encounter. EXAM: CT ABDOMEN AND PELVIS WITH CONTRAST TECHNIQUE: Multidetector CT imaging of the abdomen and pelvis was performed using the standard protocol following bolus administration of intravenous contrast. CONTRAST:  ISOVUE-300 IOPAMIDOL (ISOVUE-300) INJECTION 61% COMPARISON:  CT of the abdomen and pelvis from 10/06/2014 FINDINGS: The visualized lung bases are clear. A 2.1 cm hypodensity within the right hepatic lobe with peripheral filling has not changed significantly in size since 2012 and likely reflects an hemangioma. Additional smaller hypodensities are nonspecific but may reflect small cysts. The spleen is unremarkable in appearance. The patient is status post cholecystectomy, with clips noted along the gallbladder  fossa. The pancreas and adrenal glands are unremarkable. The kidneys are unremarkable in appearance. There is no evidence of hydronephrosis. No renal or ureteral stones are seen. No perinephric stranding is appreciated. No free fluid is identified. The small bowel is unremarkable in appearance. The stomach is within normal limits. No acute vascular abnormalities are seen. The appendix is normal in caliber and contains air, without evidence for appendicitis. Diverticulosis is noted along the descending and proximal sigmoid colon. Mild focal soft tissue inflammation is noted at the proximal to mid sigmoid colon, with an inflamed diverticulum, concerning for minimal diverticulitis. The bladder is moderately distended and grossly unremarkable. The prostate is borderline normal in size, measuring approximately 4.7 cm in transverse dimension. No inguinal lymphadenopathy is seen. No acute osseous abnormalities are identified. IMPRESSION: 1. Mild acute diverticulitis at the proximal to mid sigmoid colon, with inflamed diverticulum and mild focal soft tissue inflammation. No evidence of perforation or abscess formation. 2. Underlying diverticulosis along the descending and proximal sigmoid colon. 3. Hepatic hemangioma and likely tiny hepatic cysts are stable in appearance. Electronically Signed   By: Roanna Raider M.D.   On: 07/02/2015 19:22   I have personally reviewed and evaluated these images and lab results as part of my medical decision-making.    MDM   Final diagnoses:  Diverticulitis  of large intestine without perforation or abscess without bleeding    Afebrile, well appearing patient with a history of diverticulitis presents with new as of today RLQ pain in addition to LLQ pain for 5 days. Patient already on prescribed treatment of diverticulitis (Cipro & Flagyl). With new onset RLQ pain there is concern for appendicitis. Will get a CT scan of abdomin. I reviewed the CT results which revealed mild acute  diverticulitis at the proximal to mid sigmoid colon with no signs of perforation or abscess formation. Also the CT showed the appendix is normal without evidence of appendicitis. No concern for colonic abscess, perforation or appendicitis.  Patient was given Zosyn IV while in the ED because there was concern of an abscess with his not improving pain. With the reassuring results from the CT we will discharge home. We have instructed him to continue to take the Cipro and Flagyl as prescribed 3 days ago and to follow up with his PCP. We discussed ED return precautions.   I discussed all of the results with the patient and family members they have expressed their understanding to the verbal discharge instructions.      Jerre SimonJessica L Rasheen Schewe, PA 07/02/15 2055  Derwood KaplanAnkit Nanavati, MD 07/03/15 1355

## 2015-07-02 NOTE — Discharge Instructions (Signed)
Follow up with you PCP early next week.   Return to the ED if you experience rectal bleeding, fever, worsening abdominal pain, nausea, or vomiting.  Diverticulitis Diverticulitis is when small pockets that have formed in your colon (large intestine) become infected or swollen. HOME CARE  Follow your doctor's instructions.  Follow a special diet if told by your doctor.  When you feel better, your doctor may tell you to change your diet. You may be told to eat a lot of fiber. Fruits and vegetables are good sources of fiber. Fiber makes it easier to poop (have bowel movements).  Take supplements or probiotics as told by your doctor.  Only take medicines as told by your doctor.  Keep all follow-up visits with your doctor. GET HELP IF:  Your pain does not get better.  You have a hard time eating food.  You are not pooping like normal. GET HELP RIGHT AWAY IF:  Your pain gets worse.  Your problems do not get better.  Your problems suddenly get worse.  You have a fever.  You keep throwing up (vomiting).  You have bloody or black, tarry poop (stool). MAKE SURE YOU:   Understand these instructions.  Will watch your condition.  Will get help right away if you are not doing well or get worse.   This information is not intended to replace advice given to you by your health care provider. Make sure you discuss any questions you have with your health care provider.   Document Released: 08/22/2007 Document Revised: 03/10/2013 Document Reviewed: 01/28/2013 Elsevier Interactive Patient Education [employer]2016 [employer]

## 2016-08-19 IMAGING — CT CT ABD-PELV W/ CM
2 of 5 series · 16 of 46 positions shown, 18 images · IV contrast (omnipaque)
Comparison: Prior CT from 05/25/2010.

CLINICAL DATA: Initial evaluation for acute right lower abdominal
pain.

EXAM:
CT ABDOMEN AND PELVIS WITH CONTRAST
TECHNIQUE: Multidetector CT imaging of the abdomen and pelvis was performed
using the standard protocol following bolus administration of
intravenous contrast.
CONTRAST:  100mL OMNIPAQUE IOHEXOL 300 MG/ML  SOLN

[Series 2: abd/pel with · axial · 0.72mm/px · z∈[+622,+1007]mm · 13 of 87 slices shown, 15 images]
[im 5/87  soft-tissue]
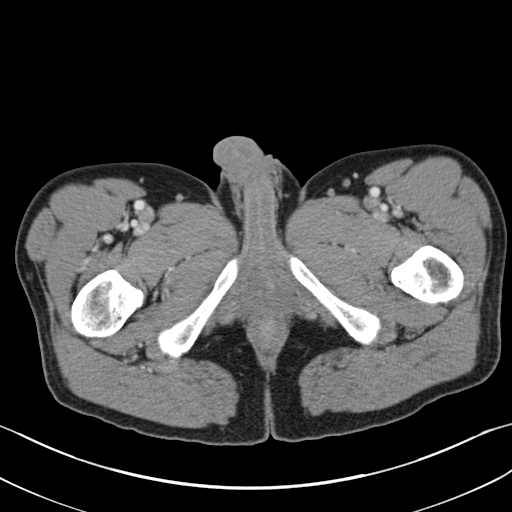
[im 5/87  bone]
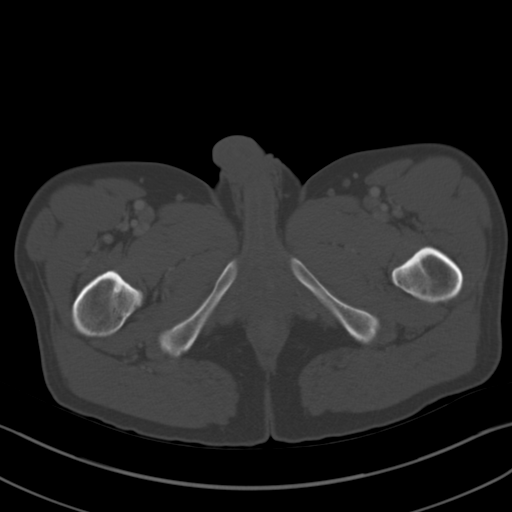
[im 13/87  soft-tissue]
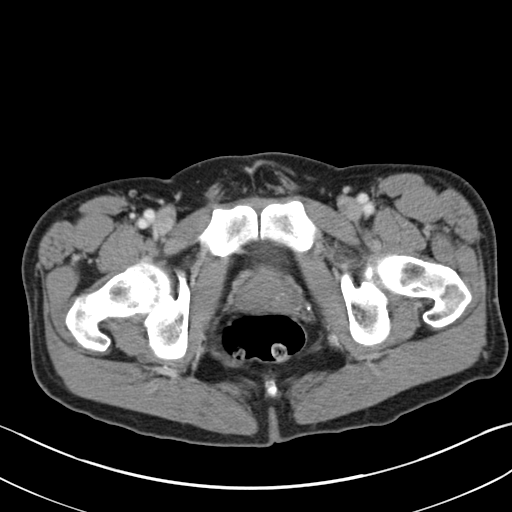
[im 18/87  soft-tissue]
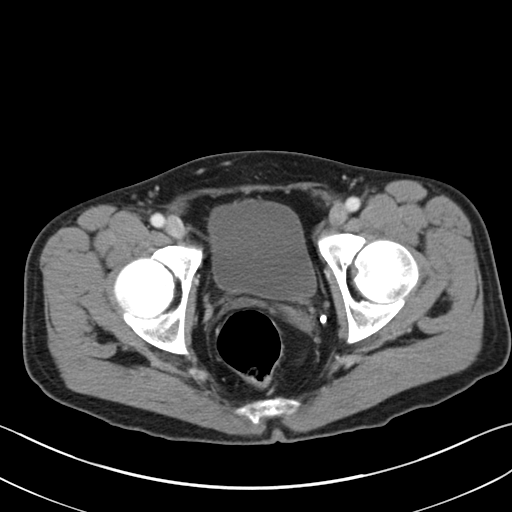
[im 26/87  soft-tissue]
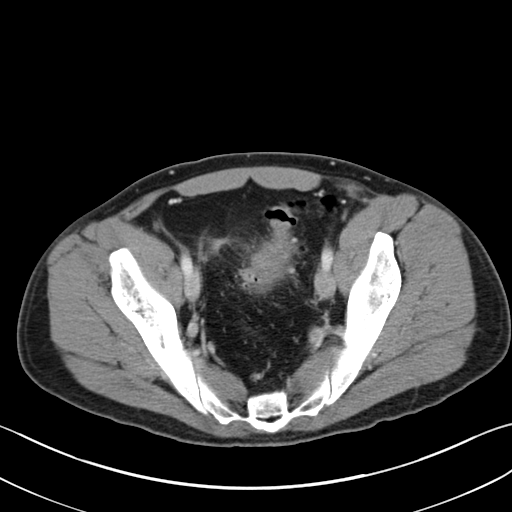
[im 31/87  soft-tissue]
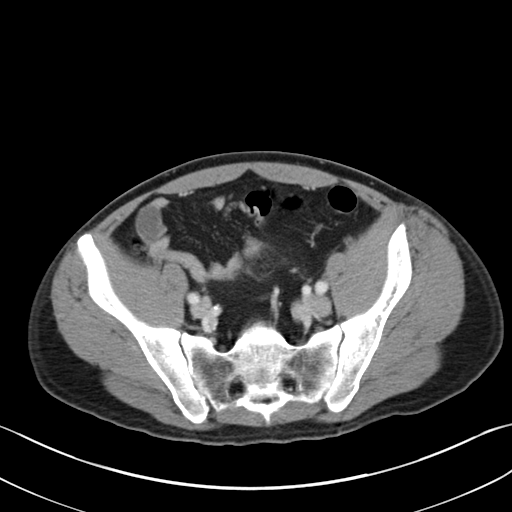
[im 39/87  soft-tissue]
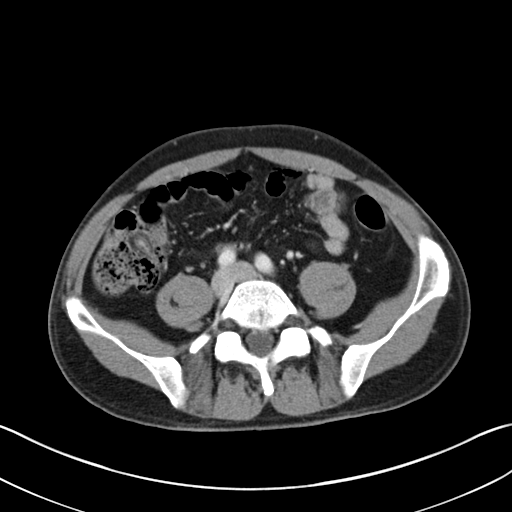
[im 44/87  soft-tissue]
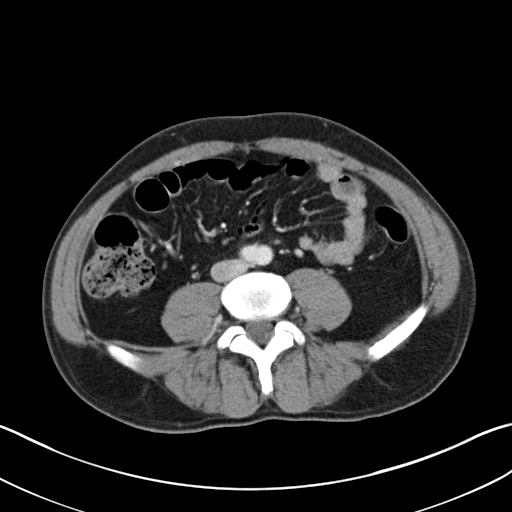
[im 48/87  soft-tissue]
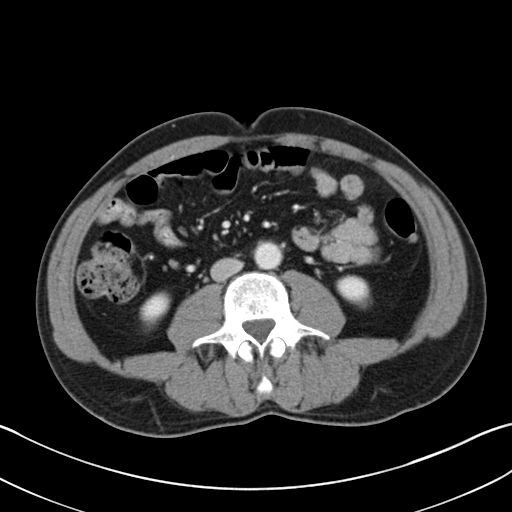
[im 56/87  soft-tissue]
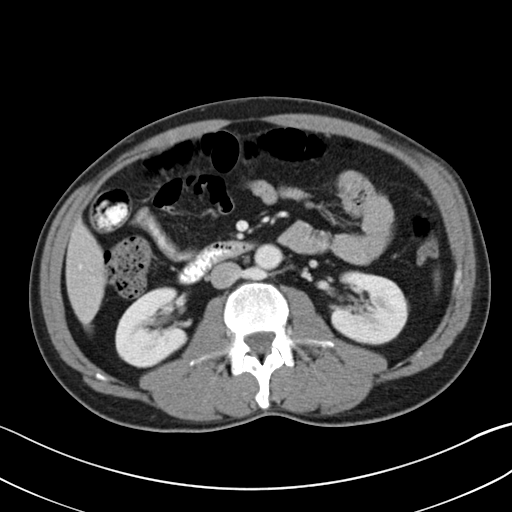
[im 56/87  bone]
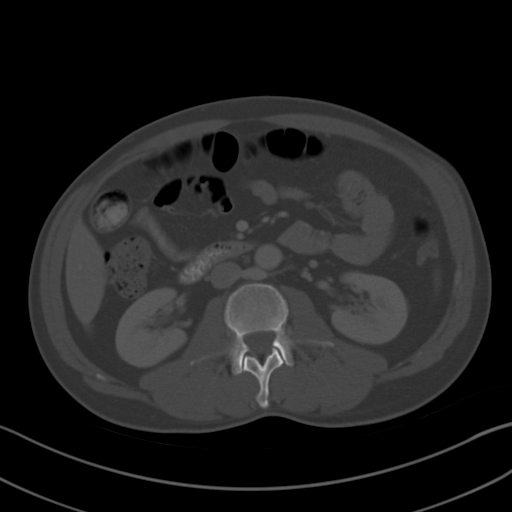
[im 61/87  soft-tissue]
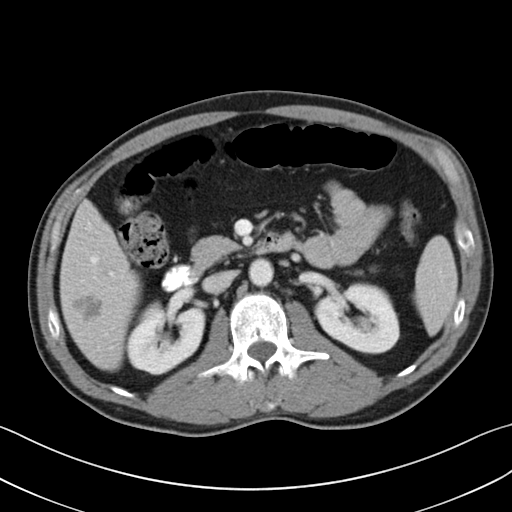
[im 69/87  soft-tissue]
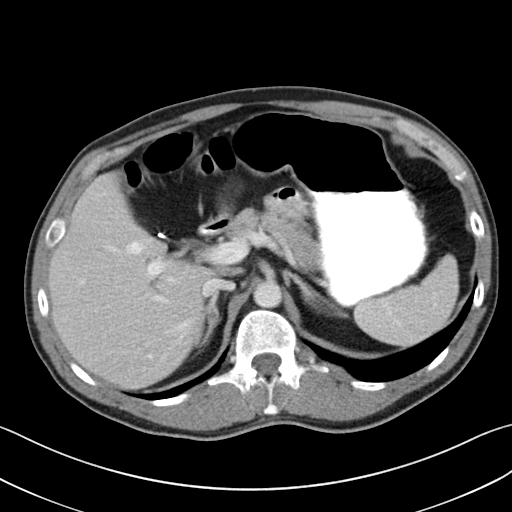
[im 74/87  soft-tissue]
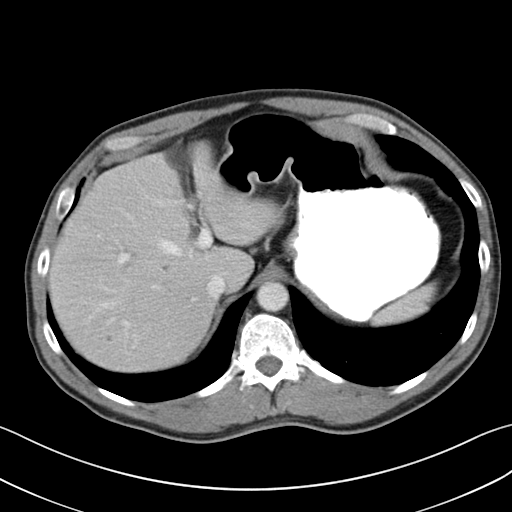
[im 82/87  soft-tissue]
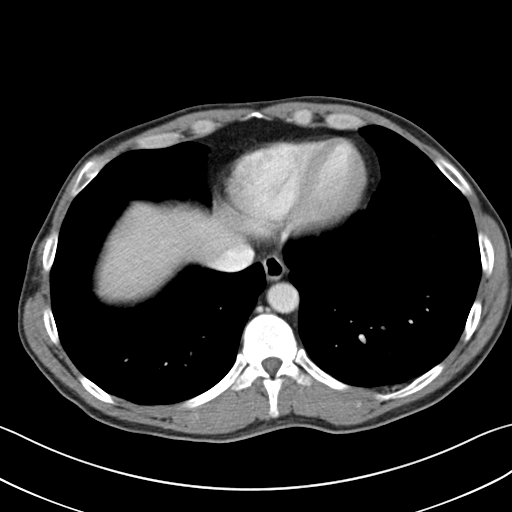

[Series 4: coronal a/|p · coronal · 0.73mm/px · 3 of 81 slices shown]
[im 27/81  soft-tissue]
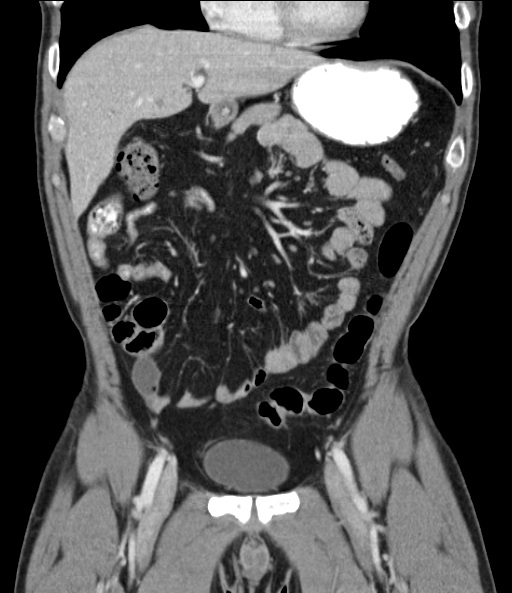
[im 36/81  soft-tissue]
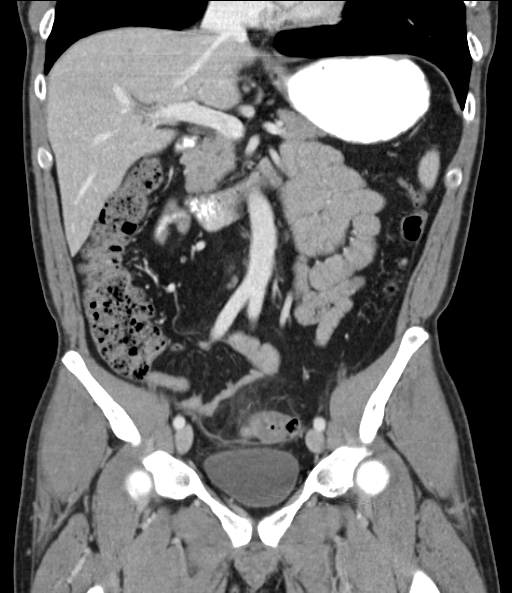
[im 45/81  soft-tissue]
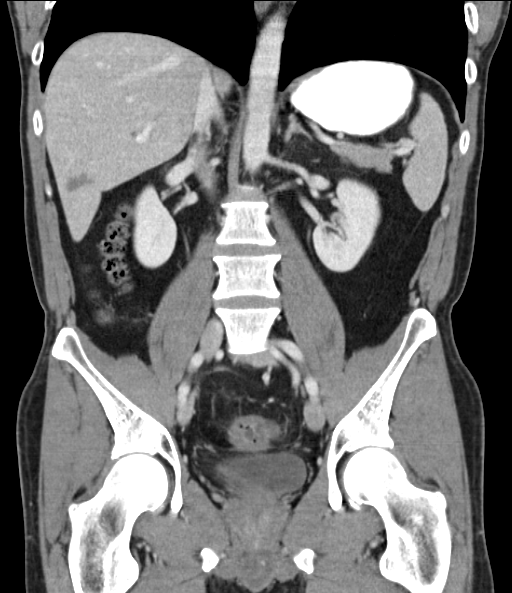

[16 of 46 positions shown; findings below may reference images not displayed]

FINDINGS: Visualized lung bases are clear.

2 cm hypodense lesion with peripheral nodular enhancement within the
inferior right hepatic lobe is most consistent with a benign
hemangioma. Additional scattered hypodense lesions within the liver
are too small the characterize by CT, but may reflect small cysts.
Liver is otherwise unremarkable. Gallbladder is absent. Mild intra
and extrahepatic biliary dilatation, which may reflect post
cholecystectomy changes.

Spleen, adrenal glands, and pancreas demonstrate a normal contrast
enhanced appearance.

Kidneys are equal in size with symmetric enhancement. No
nephrolithiasis, hydronephrosis, or focal enhancing renal mass.

Stomach within normal limits. No evidence for bowel obstruction.
Appendix well visualized in the right lower quadrant and is of
normal caliber and appearance without associated inflammatory
changes to suggest acute appendicitis. Multiple inflamed diverticula
present within the sigmoid colon, compatible with acute
diverticulitis. No free air to suggest perforation. No diverticular
abscess.

Bladder within normal limits.  Prostate normal.

No free air identified. Trace free fluid within the pelvis, likely
related to the inflammatory changes about sigmoid colon. No
pathologically enlarged intra-abdominal pelvic lymph nodes. Normal
intravascular enhancement seen within the intra-abdominal aorta and
its branch vessels. Incidental note made of a circumaortic left
renal vein.

No acute osseous abnormality. No worrisome lytic or blastic osseous
lesions.
IMPRESSION: 1. Findings consistent with acute sigmoid diverticulitis. No
evidence for perforation or other complication.
2. No other acute intra-abdominal pelvic process identified.
